# Patient Record
Sex: Male | Born: 1965 | Race: White | Hispanic: No | Marital: Single
Health system: Southern US, Community
[De-identification: ages and names within clinical notes are randomized; demographics above are authoritative.]

---

## 2013-04-15 ENCOUNTER — Inpatient Hospital Stay: Payer: Self-pay | Admitting: Internal Medicine

## 2013-04-15 LAB — CBC
HCT: 47.8 %
HGB: 16.8 g/dL
MCH: 30.4 pg
MCHC: 35.1 g/dL
MCV: 87 fL
Platelet: 247 x10 3/mm 3
RBC: 5.52 x10 6/mm 3
RDW: 13.7 %
WBC: 12.8 x10 3/mm 3 — ABNORMAL HIGH

## 2013-04-15 LAB — COMPREHENSIVE METABOLIC PANEL
ALBUMIN: 4.5 g/dL (ref 3.4–5.0)
ALK PHOS: 126 U/L — AB
Anion Gap: 15 (ref 7–16)
BILIRUBIN TOTAL: 0.6 mg/dL (ref 0.2–1.0)
BUN: 13 mg/dL (ref 7–18)
CALCIUM: 9.1 mg/dL (ref 8.5–10.1)
Chloride: 90 mmol/L — ABNORMAL LOW (ref 98–107)
Co2: 24 mmol/L (ref 21–32)
Creatinine: 0.98 mg/dL (ref 0.60–1.30)
EGFR (Non-African Amer.): >60
Glucose: 408 mg/dL — ABNORMAL HIGH (ref 65–99)
Osmolality: 276 (ref 275–301)
Potassium: 3.8 mmol/L (ref 3.5–5.1)
SGOT(AST): 21 U/L (ref 15–37)
SGPT (ALT): 28 U/L (ref 12–78)
Sodium: 129 mmol/L — ABNORMAL LOW (ref 136–145)
Total Protein: 8.1 g/dL (ref 6.4–8.2)

## 2013-04-15 LAB — TROPONIN I

## 2013-04-15 LAB — LIPASE, BLOOD: Lipase: 7001 U/L — ABNORMAL HIGH

## 2013-04-16 LAB — COMPREHENSIVE METABOLIC PANEL
ALT: 25 U/L (ref 12–78)
AST: 42 U/L — AB (ref 15–37)
Albumin: 4 g/dL (ref 3.4–5.0)
Alkaline Phosphatase: 97 U/L
Anion Gap: 17 — ABNORMAL HIGH (ref 7–16)
BUN: 9 mg/dL (ref 7–18)
Bilirubin,Total: 0.9 mg/dL (ref 0.2–1.0)
Calcium, Total: 7.9 mg/dL — ABNORMAL LOW (ref 8.5–10.1)
Chloride: 97 mmol/L — ABNORMAL LOW (ref 98–107)
Co2: 11 mmol/L — ABNORMAL LOW (ref 21–32)
Creatinine: 0.44 mg/dL — ABNORMAL LOW (ref 0.60–1.30)
EGFR (African American): >60
EGFR (Non-African Amer.): >60
Glucose: 351 mg/dL — ABNORMAL HIGH (ref 65–99)
OSMOLALITY: 272 (ref 275–301)
Potassium: 4.6 mmol/L (ref 3.5–5.1)
SODIUM: 125 mmol/L — AB (ref 136–145)
Total Protein: 8.3 g/dL — ABNORMAL HIGH (ref 6.4–8.2)

## 2013-04-16 LAB — CBC WITH DIFFERENTIAL/PLATELET
Basophil #: 0 10*3/uL (ref 0.0–0.1)
Basophil %: 0.2 %
EOS ABS: 0 10*3/uL (ref 0.0–0.7)
Eosinophil %: 0.1 %
HCT: 47.7 % (ref 40.0–52.0)
HGB: 16.6 g/dL (ref 13.0–18.0)
LYMPHS PCT: 7.9 %
Lymphocyte #: 1.5 10*3/uL (ref 1.0–3.6)
MCH: 30.6 pg (ref 26.0–34.0)
MCHC: 34.9 g/dL (ref 32.0–36.0)
MCV: 88 fL (ref 80–100)
MONO ABS: 1.7 x10 3/mm — AB (ref 0.2–1.0)
Monocyte %: 9.4 %
NEUTROS ABS: 15.3 10*3/uL — AB (ref 1.4–6.5)
Neutrophil %: 82.4 %
Platelet: 276 10*3/uL (ref 150–440)
RBC: 5.43 10*6/uL (ref 4.40–5.90)
RDW: 13.6 % (ref 11.5–14.5)
WBC: 18.6 10*3/uL — ABNORMAL HIGH (ref 3.8–10.6)

## 2013-04-16 LAB — LIPID PANEL
CHOLESTEROL: 265 mg/dL — AB (ref 0–200)
Cholesterol: 362 mg/dL — ABNORMAL HIGH (ref 0–200)
HDL: 14 mg/dL — AB (ref 40–60)
HDL: 24 mg/dL — AB (ref 40–60)
Triglycerides: >4000 mg/dL — ABNORMAL HIGH (ref 0–200)
Triglycerides: 1487 mg/dL — ABNORMAL HIGH (ref 0–200)

## 2013-04-16 LAB — TSH: THYROID STIMULATING HORM: 1.54 u[IU]/mL

## 2013-04-16 LAB — HEMOGLOBIN A1C: HEMOGLOBIN A1C: 12 % — AB (ref 4.2–6.3)

## 2013-04-17 LAB — CBC WITH DIFFERENTIAL/PLATELET
Basophil #: 0.1 10*3/uL (ref 0.0–0.1)
Basophil %: 0.6 %
EOS ABS: 0 10*3/uL (ref 0.0–0.7)
Eosinophil %: 0.1 %
HCT: 44.5 % (ref 40.0–52.0)
HGB: 15.5 g/dL (ref 13.0–18.0)
LYMPHS PCT: 6.7 %
Lymphocyte #: 0.9 10*3/uL — ABNORMAL LOW (ref 1.0–3.6)
MCH: 30.7 pg (ref 26.0–34.0)
MCHC: 34.8 g/dL (ref 32.0–36.0)
MCV: 88 fL (ref 80–100)
MONOS PCT: 12.6 %
Monocyte #: 1.7 x10 3/mm — ABNORMAL HIGH (ref 0.2–1.0)
Neutrophil #: 10.7 10*3/uL — ABNORMAL HIGH (ref 1.4–6.5)
Neutrophil %: 80 %
PLATELETS: 199 10*3/uL (ref 150–440)
RBC: 5.05 10*6/uL (ref 4.40–5.90)
RDW: 13.5 % (ref 11.5–14.5)
WBC: 13.3 10*3/uL — ABNORMAL HIGH (ref 3.8–10.6)

## 2013-04-17 LAB — BASIC METABOLIC PANEL
Anion Gap: 13 (ref 7–16)
BUN: 11 mg/dL (ref 7–18)
CALCIUM: 8.7 mg/dL (ref 8.5–10.1)
Chloride: 104 mmol/L (ref 98–107)
Co2: 13 mmol/L — ABNORMAL LOW (ref 21–32)
Creatinine: 0.66 mg/dL (ref 0.60–1.30)
EGFR (African American): >60
EGFR (Non-African Amer.): >60
Glucose: 259 mg/dL — ABNORMAL HIGH (ref 65–99)
OSMOLALITY: 269 (ref 275–301)
Potassium: 4.3 mmol/L (ref 3.5–5.1)
SODIUM: 130 mmol/L — AB (ref 136–145)

## 2013-04-18 LAB — CBC WITH DIFFERENTIAL/PLATELET
BASOS ABS: 0 10*3/uL (ref 0.0–0.1)
Basophil %: 0.3 %
Eosinophil #: 0 10*3/uL (ref 0.0–0.7)
Eosinophil %: 0.3 %
HCT: 37.6 % — AB (ref 40.0–52.0)
HGB: 13.1 g/dL (ref 13.0–18.0)
LYMPHS ABS: 0.7 10*3/uL — AB (ref 1.0–3.6)
LYMPHS PCT: 8.4 %
MCH: 30.6 pg (ref 26.0–34.0)
MCHC: 34.9 g/dL (ref 32.0–36.0)
MCV: 88 fL (ref 80–100)
MONO ABS: 1.2 x10 3/mm — AB (ref 0.2–1.0)
Monocyte %: 13.7 %
NEUTROS ABS: 6.8 10*3/uL — AB (ref 1.4–6.5)
NEUTROS PCT: 77.3 %
Platelet: 169 10*3/uL (ref 150–440)
RBC: 4.29 10*6/uL — ABNORMAL LOW (ref 4.40–5.90)
RDW: 14.1 % (ref 11.5–14.5)
WBC: 8.8 10*3/uL (ref 3.8–10.6)

## 2013-04-18 LAB — BASIC METABOLIC PANEL
Anion Gap: 9 (ref 7–16)
BUN: 12 mg/dL (ref 7–18)
Calcium, Total: 8.3 mg/dL — ABNORMAL LOW (ref 8.5–10.1)
Chloride: 106 mmol/L (ref 98–107)
Co2: 17 mmol/L — ABNORMAL LOW (ref 21–32)
Creatinine: 0.65 mg/dL (ref 0.60–1.30)
EGFR (Non-African Amer.): >60
GLUCOSE: 209 mg/dL — AB (ref 65–99)
OSMOLALITY: 270 (ref 275–301)
POTASSIUM: 3.4 mmol/L — AB (ref 3.5–5.1)
Sodium: 132 mmol/L — ABNORMAL LOW (ref 136–145)

## 2013-04-18 LAB — LIPASE, BLOOD: LIPASE: 327 U/L (ref 73–393)

## 2013-04-19 LAB — BASIC METABOLIC PANEL
Anion Gap: 11 (ref 7–16)
BUN: 13 mg/dL (ref 7–18)
CALCIUM: 9 mg/dL (ref 8.5–10.1)
CHLORIDE: 103 mmol/L (ref 98–107)
Co2: 17 mmol/L — ABNORMAL LOW (ref 21–32)
Creatinine: 0.72 mg/dL (ref 0.60–1.30)
EGFR (Non-African Amer.): >60
Glucose: 243 mg/dL — ABNORMAL HIGH (ref 65–99)
Osmolality: 271 (ref 275–301)
Potassium: 3.8 mmol/L (ref 3.5–5.1)
SODIUM: 131 mmol/L — AB (ref 136–145)

## 2013-04-19 LAB — MAGNESIUM: Magnesium: 1.8 mg/dL

## 2013-04-19 LAB — TRIGLYCERIDES: TRIGLYCERIDES: 529 mg/dL — AB (ref 0–200)

## 2014-07-12 NOTE — H&P (Signed)
PATIENT NAME:  Charles Terrell, Charles Terrell MR#:  [id number] DATE OF BIRTH:  07/18/1965  DATE OF ADMISSION:  04/15/2013  PRIMARY CARE PHYSICIAN: Nonlocal.   REFERRING PHYSICIAN: Dr. Janalyn Harderavid Kaminski.   CHIEF COMPLAINT: Abdominal pain.   HISTORY OF PRESENT ILLNESS: Mr. Charles Terrell is a 49 year old, pleasant, white male with a history of diabetes mellitus who ran out of his medication since 02/18/2013. Has been experiencing polyuria, polydipsia. Today, [facility] Started to experience pain in the left upper quadrant, severe pain, 10/10 intensity. Followed by having an episode of vomiting. The patient initially attributed it to stomach upset. Took some TUMS without much improvement. The patient continued to have severe worsening of the pain. Concerning this, came to the Emergency Department. Workup in the Emergency Department, the patient was found to have a lipase of 7000. CT abdomen and pelvis was done and showed cholelithiasis. No signs of any cholecystitis. The patient's initial blood sugars were 470. The patient was also found to have mild elevation of the WBC count.   PAST MEDICAL HISTORY:  1. Hypertension.  2. Diabetes mellitus, insulin dependent.   PAST SURGICAL HISTORY: None.   ALLERGIES: COMPAZINE.   HOME MEDICATIONS:  1. Simvastatin 40 mg once a Kreitzer.  2. Metformin 850 mg 2 times a Apachito.  3. Lisinopril 5 mg once a Bible.  4. Levemir 65 units once a Loveless.  5. Glimepiride 4 mg once a Salvetti.  6. Fish oil 1200 mg once a Gathers.  7. Claritin 24-hour allergy.   8. Citalopram 20 mg once a Vandewater.   SOCIAL HISTORY: Denies smoking, drinking alcohol or using illicit drugs. Married, lives with his wife. Works in [employer].   FAMILY HISTORY: Diabetes mellitus, hypertension.   REVIEW OF SYSTEMS:  CONSTITUTIONAL: Generalized weakness.  EYES: No change in vision.  ENT: No change in hearing. No sore throat.  RESPIRATORY: No cough, shortness of breath.  CARDIOVASCULAR: No chest pain,  palpations.  GASTROINTESTINAL: Has nausea, vomiting, abdominal pain.  GENITOURINARY: No dysuria or hematuria.  ENDOCRINE: Has polyuria, polydipsia.  HEMATOLOGIC: No easy bruising or bleeding.  SKIN: No rash or lesions.  MUSCULOSKELETAL: No joint pains and aches.  NEUROLOGIC: No  weakness  or numbness in any part of the body.   PHYSICAL EXAMINATION:  GENERAL: This is a well-built, well-nourished, age-appropriate male lying down in the bed, not in distress.  VITAL SIGNS: Temperature 98.7, pulse 91, blood pressure 178/99, respiratory rate of 18, oxygen saturation is 94% on room air.  HEENT: Head normocephalic, atraumatic. There is no scleral icterus. Conjunctivae normal. Pupils equal and react to light. Extraocular movements are intact. Mucous membranes moist. No pharyngeal erythema.  NECK: Supple. No lymphadenopathy. No JVD. No carotid bruit.  CHEST: Has no focal tenderness.  LUNGS: Bilaterally clear to auscultation.  HEART: S1 and S2 regular. No murmurs are heard.  ABDOMEN: Bowel sounds present. Soft. Has tenderness in the left upper quadrant. No guarding or rebound tenderness.  EXTREMITIES: No pedal edema. Pulses 2+.  NEUROLOGIC: The patient is alert, oriented to place, person and time. Cranial nerves II through XII intact. Motor 5/5 in upper and lower extremities.  SKIN: No rash or lesions.  MUSCULOSKELETAL: Good range of motion in all of the extremities.   LABORATORIES: CMP is completely within normal limits except for glucose of 408, sodium of 129. WBC count of 12.8. Troponin less than 0.02. Lipase 7000.    ASSESSMENT AND PLAN: Mr. Charles Terrell is a 49 year old male who comes to  the Emergency Department with acute pancreatitis.  1. Acute pancreatitis: The patient has cholelithiasis. The possibility of gallstone pancreatitis; however, the patient also has poorly-controlled blood sugars for the last 2 months. The possibility of hypertriglyceridemia. We will obtain lipid profile. Continue with  intravenous fluids, pain management, antinausea medication.  2. Cholelithiasis: Will consult surgery for possible cholecystectomy. The patient does not have any elevated LFTs. Will not get any right upper quadrant ultrasound for now.  3. Diabetes mellitus, poorly controlled: Will obtain hemoglobin A1c. The patient states he ran out of his medications and could not get an appointment with his primary care physician. Continue with the home dose of the Levemir . Will hold the metformin and glimepiride. Continue sliding scale insulin.  4. Hypertension, poorly controlled: Will hold the lisinopril for now.  5. Keep the patient on deep vein thrombosis prophylaxis with Lovenox.   TIME SPENT: 50 minutes.   ____________________________ Susa Griffins, MD pv:gb D: 04/15/2013 23:39:11 ET T: 04/16/2013 01:45:15 ET JOB#: [id number]  cc: Susa Griffins, MD, <Dictator> Susa Griffins MD ELECTRONICALLY SIGNED 04/28/2013 4:09

## 2014-07-12 NOTE — Discharge Summary (Signed)
PATIENT NAME:  Charles Terrell, Charles Terrell MR#:  [id number] DATE OF BIRTH:  Feb 15, 1966  DATE OF ADMISSION:  04/15/2013 DATE OF DISCHARGE:  04/19/2013  FINAL DIAGNOSES: 1.  Acute pancreatitis.  2.  Hypertriglyceridemia.  3.  Poorly controlled diabetes.  4.  Gallstones.  5.  Possible renal cell carcinoma, kidney mass on MRI.  6.  Hypertension.   DISCHARGE MEDICATIONS: Include: 1.  Lisinopril 5 mg daily. 2.  Celexa 20 mg daily. 3.  Fish oil 1200 mg daily. 4.  Claritin 24 hour relief 10 mg daily. 5.  Gemfibrozil 600 mg twice a Ofarrell. 6.  Magnesium oxide 400 mg at bedtime.  7.  Levemir FlexPen 35 units subcutaneous injection nightly. 8.  NovoLog FlexPen subcutaneous sliding scale before meals and at bedtime. 9.  Acetaminophen/oxycodone 325/10 mg 1 tablet every 6 hours as needed for pain.  NOTE: Stop metformin, stop simvastatin, and stop glimepiride.  DISCHARGE DIET: Low sodium diet, low fat, low cholesterol diet, carbohydrate controlled. Diet is regular consistency.   DISCHARGE ACTIVITY: As tolerated.  DISCHARGE INSTRUCTIONS: Dr. Evelene Croon, urology, here recommended Dr. Cherylin Mylar at [facility] to evaluate the patient's renal mass. Follow-up in 1 to 2 weeks with your medical doctor.   REASON FOR ADMISSION: The patient was admitted 04/15/2013 and discharged 04/19/2013. Came in with abdominal pain. He was found to have an acute pancreatitis and given IV fluid hydration.  LABORATORY AND RADIOLOGICAL DATA DURING THE HOSPITAL COURSE: Included an EKG that showed normal sinus rhythm, no acute ST-T wave changes. Total cholesterol 362, triglycerides greater than 4000, HDL 14. Lipase 7000. Glucose 408, BUN 13, creatinine 0.98, sodium 129, potassium 3.8, chloride 90, CO2 24, calcium 9.1. Liver function tests normal range. White blood cell count 12.8, H and H 16.8 and 47.8, platelet count 247. Troponins negative. Repeat EKG showed normal sinus rhythm, no acute ST-T wave changes. CT scan of the abdomen and pelvis with contrast  showed stranding, ill-defined fluid centered about the tail of the pancreas, likely acute pancreatitis, thickening of the posterior gastric wall, a 1.6 cm right renal lesion, cholelithiasis without evidence of cholecystitis, and hepatic steatosis. TSH 1.54. Repeat triglycerides came down to 1487. Hemoglobin A1c was 12. MRI of the abdomen with and without contrast showed focal enhancing right renal lesion measuring 1.3 x 1.1 cm, likely a small renal cell carcinoma, acute pancreatitis without findings of pancreatic necrosis, abscess or pseudocyst, cholelithiasis, but no filling defect in the common bile duct or biliary dilatation not observed. Upon discharge, triglycerides 529. Lipase normalized. The patient was tolerating diet.  HOSPITAL COURSE PER PROBLEM LIST:  1.  For the patient's acute pancreatitis, the patient was kept n.p.o. initially, given IV fluid hydration and pain control. The patient did improve, his lipase normalized. The patient was tolerating diet upon discharge. Given as needed Percocet for pain upon discharge.  2.  Hypertriglyceridemia with triglycerides greater than 4000. I believe this is probably the cause of the patient's pancreatitis. The patient was started on gemfibrozil. Triglycerides did come down to 500.  3.  Poorly controlled diabetes. The patient ran out of his medications. I did prescribe Levemir FlexPen and NovoLog upon discharge. I did bring him up to 35 units at night. He can probably slowly go up to his usual 70, but that may take some time depending on how he is eating.  4.  Gallstones. I believe this is not the urgent thing that causes pancreatitis, but can be evaluated as outpatient, especially if they do renal cell carcinoma surgery  [facility].  5.  Possible renal cell carcinoma with kidney mass on MRI. I did speak with Dr. Evelene CroonWolff, surgery, here.  He refers out his renal cell carcinomas [facility]  6.   Hypertension. The patient placed back on lisinopril.  Of note, I did speak to the patient about this renal cell mass, that he must be followed up, that this cannot be let to be ignored. He understands the risks and will set up the appointment.   TIME SPENT ON DISCHARGE: 35 minutes. ____________________________ Herschell Dimesichard J. Renae GlossWieting, MD rjw:sb D: 04/19/2013 15:52:34 ET T: 04/19/2013 16:34:47 ET JOB#: [id number]  cc: Herschell Dimesichard J. Renae GlossWieting, MD, <Dictator> Salley ScarletICHARD J Denim Start MD ELECTRONICALLY SIGNED 04/27/2013 16:24

## 2014-07-22 IMAGING — CT CT ABD-PELV W/ CM
2 of 5 series · 16 of 46 positions shown, 18 images · IV contrast (isovue)
Comparison: None.

CLINICAL DATA: Epigastric pain

EXAM:
CT ABDOMEN AND PELVIS WITH CONTRAST
TECHNIQUE: Multidetector CT imaging of the abdomen and pelvis was performed
using the standard protocol following bolus administration of
intravenous contrast.
CONTRAST:  125 cc Isovue 370

[Series 2: routine abd pel with · axial · 0.96mm/px · z∈[-492,-62]mm · 13 of 98 slices shown, 15 images]
[im 6/98  soft-tissue]
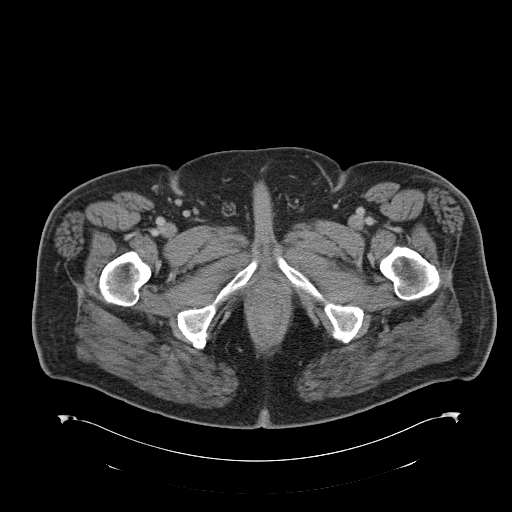
[im 6/98  bone]
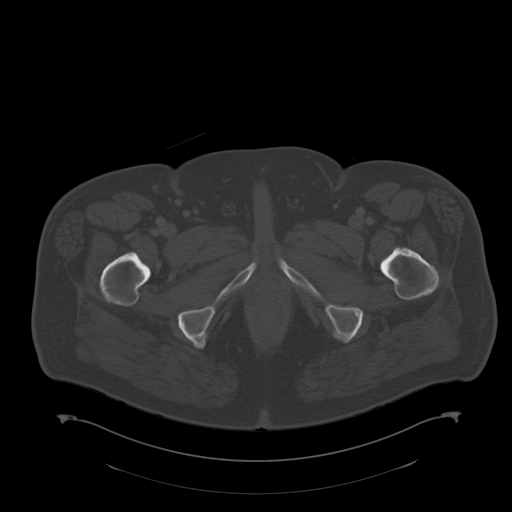
[im 11/98  soft-tissue]
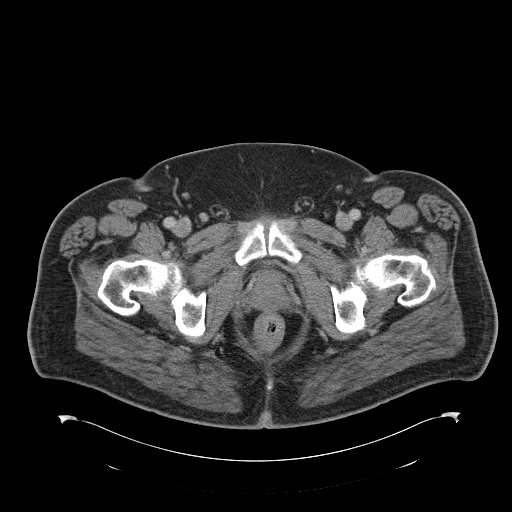
[im 22/98  soft-tissue]
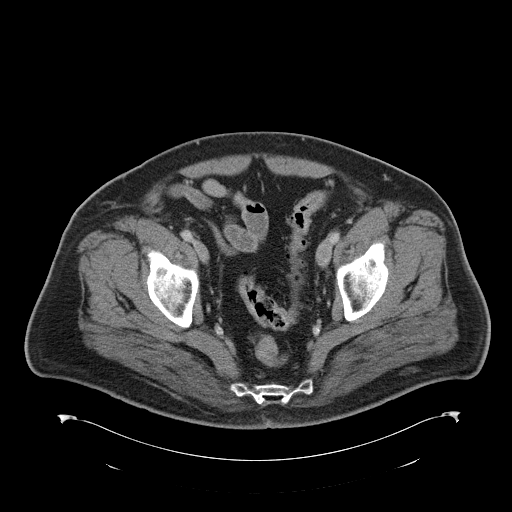
[im 27/98  soft-tissue]
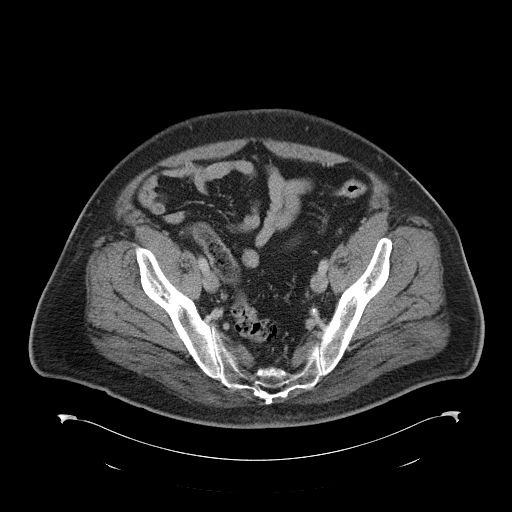
[im 33/98  soft-tissue]
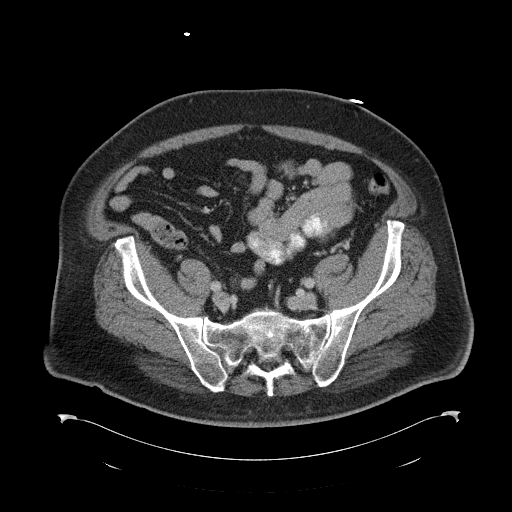
[im 44/98  soft-tissue]
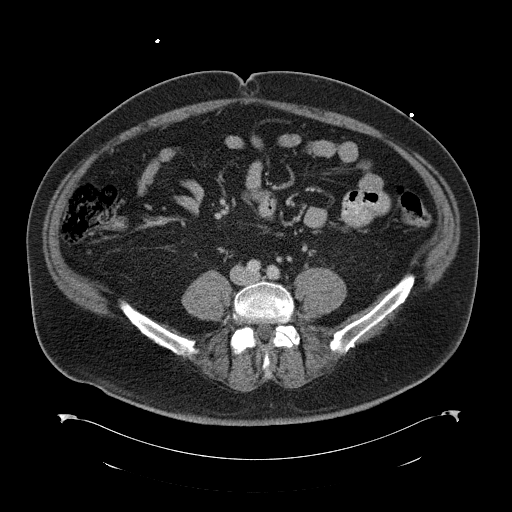
[im 49/98  soft-tissue]
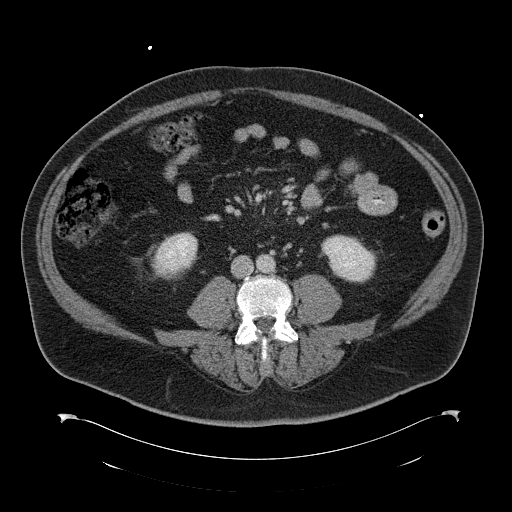
[im 54/98  soft-tissue]
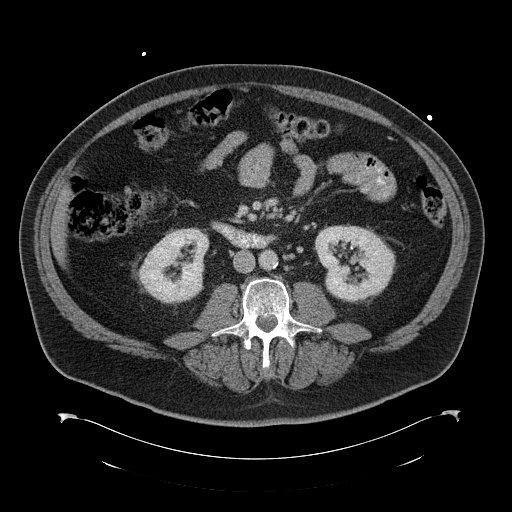
[im 65/98  soft-tissue]
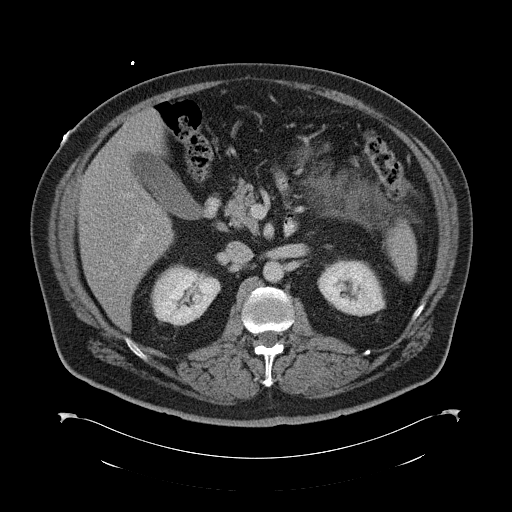
[im 65/98  bone]
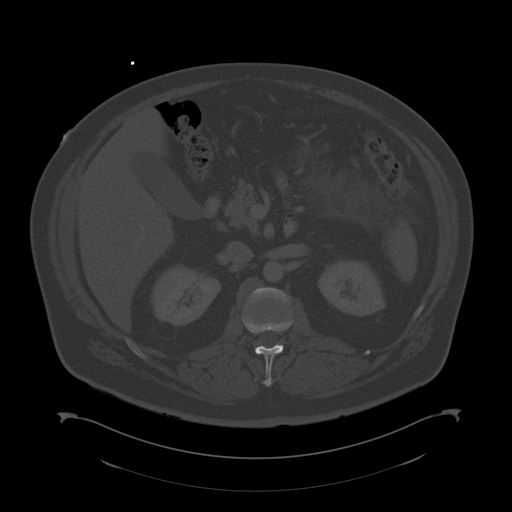
[im 71/98  soft-tissue]
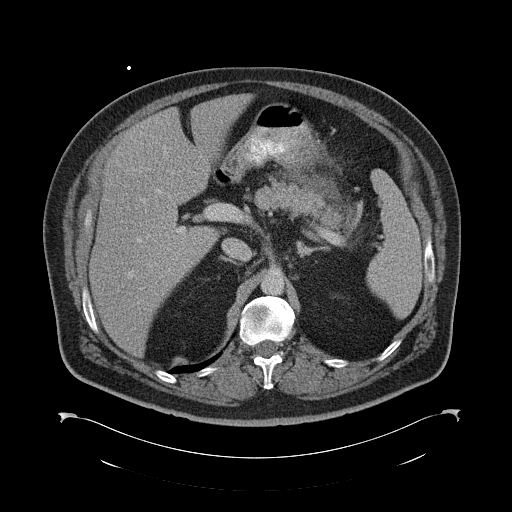
[im 76/98  soft-tissue]
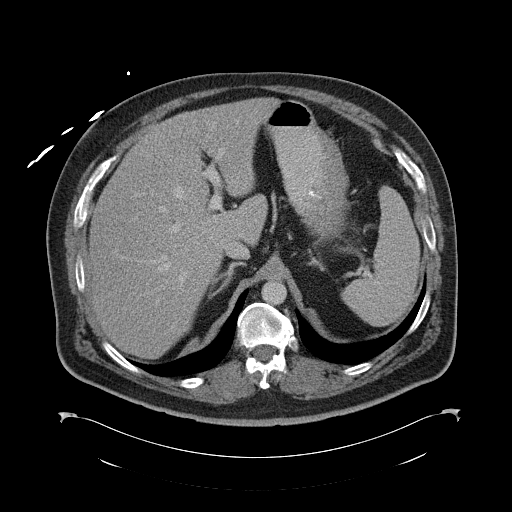
[im 87/98  soft-tissue]
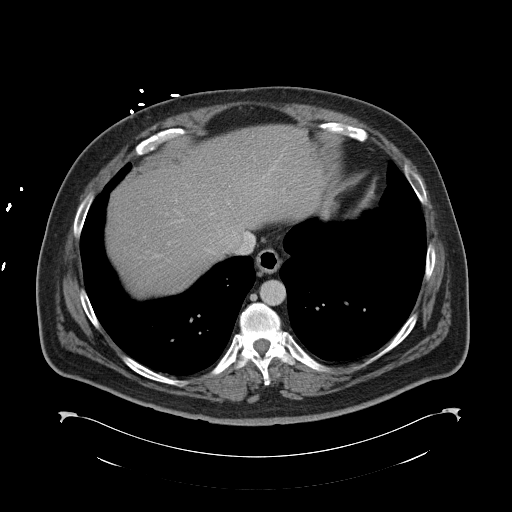
[im 92/98  soft-tissue]
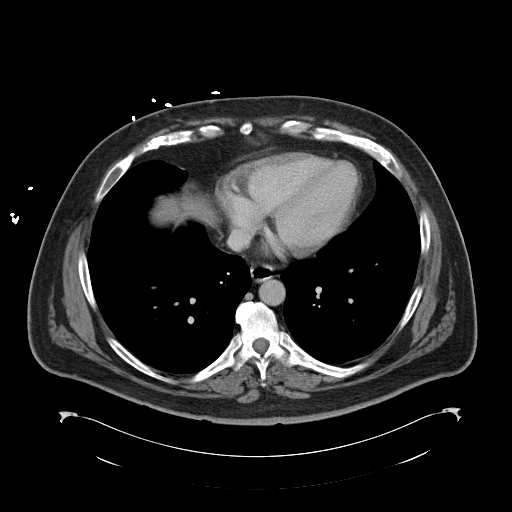

[Series 5: cor routine abd pel with · coronal · 0.85mm/px · 3 of 175 slices shown]
[im 59/175  soft-tissue]
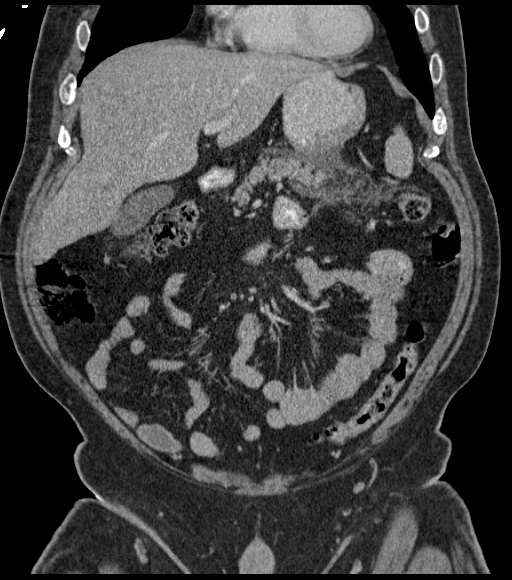
[im 78/175  soft-tissue]
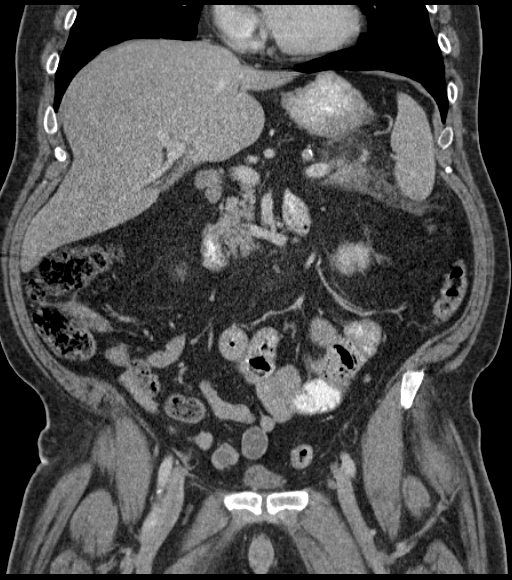
[im 97/175  soft-tissue]
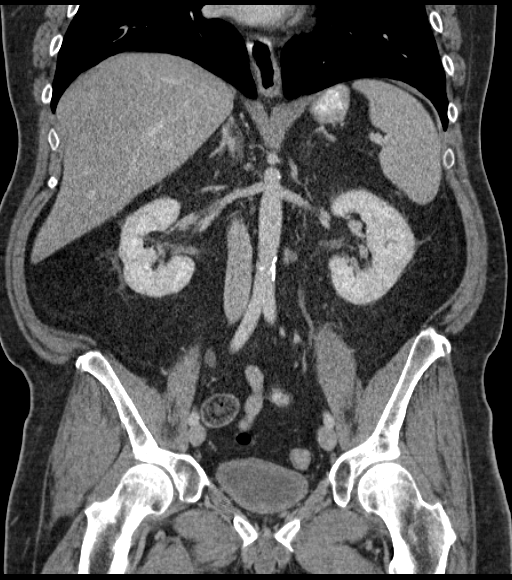

[16 of 46 positions shown; findings below may reference images not displayed]

FINDINGS: Mild linear opacity within the left lung base, favored to reflect
atelectasis or scarring. Small hiatal hernia. Normal heart size.

Decreased attenuation of the liver is nonspecific post contrast
however is most in keeping with fatty infiltration. Layering
gallstone. No biliary ductal dilatation. No pericholecystic fluid or
fat stranding. No appreciable abnormality of the spleen and adrenal
glands.

There is ill-defined fluid and stranding of the peripancreatic fat.
No loculated fluid collection. The pancreatic parenchyma
homogeneously enhances, without evidence for necrosis. There is
thickening of the posterior gastric wall which is favored to be
reactive. Mildly prominent porta hepatis lymph nodes, likely
reactive.

Arising from the upper pole right kidney posteriorly, there is a
cm lesion higher in attenuation than that of simple fluid.
Nonobstructing left renal stones. No hydroureteronephrosis.

Colonic diverticulosis. No CT evidence for colitis or
diverticulitis. Appendix not identified and per report, surgically
absent. Small bowel loops are of normal course and caliber. No free
intraperitoneal air.

Normal caliber aorta and branch vessels with scattered
atherosclerotic disease. Tiny fat containing umbilical hernia.

Partially decompressed bladder.  Normal size prostate gland.

Multilevel degenerative changes. Mild compression deformity at T12
and L1 with anterior osseous bridging, not favored to be acute.
IMPRESSION: Stranding and ill-defined fluid centered about the tail of the
pancreas, most in keeping with acute pancreatitis. Correlate with
lipase.

There is thickening of the posterior gastric wall, favored to be
reactive/ secondary to the peripancreatic process.

Indeterminate upper pole 1.6 cm right renal lesion. Differential
includes a solid neoplasm or complex (hemorrhagic/proteinaceous)
cyst. Recommend follow-up renal protocol MRI or CT.

Cholelithiasis without CT evidence for cholecystitis.

Hepatic steatosis.

Nonobstructing left renal stones.
